# Patient Record
Sex: Male | Born: 1990 | Race: Black or African American | Hispanic: No | Marital: Single | Smoking: Current every day smoker
Health system: Southern US, Community
[De-identification: ages and names within clinical notes are randomized; demographics above are authoritative.]

## PROBLEM LIST (undated history)

## (undated) DIAGNOSIS — B20 Human immunodeficiency virus [HIV] disease: Secondary | ICD-10-CM

## (undated) DIAGNOSIS — Z21 Asymptomatic human immunodeficiency virus [HIV] infection status: Secondary | ICD-10-CM

## (undated) HISTORY — PX: NO PAST SURGERIES: SHX2092

---
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
## 2010-01-29 IMAGING — CR DG CHEST 2V
2 series · 2 of 2 positions shown · non-contrast
Comparison: None

CLINICAL DATA: Chest pain

CHEST - 2 VIEW

[view not recorded (1 of 2)]
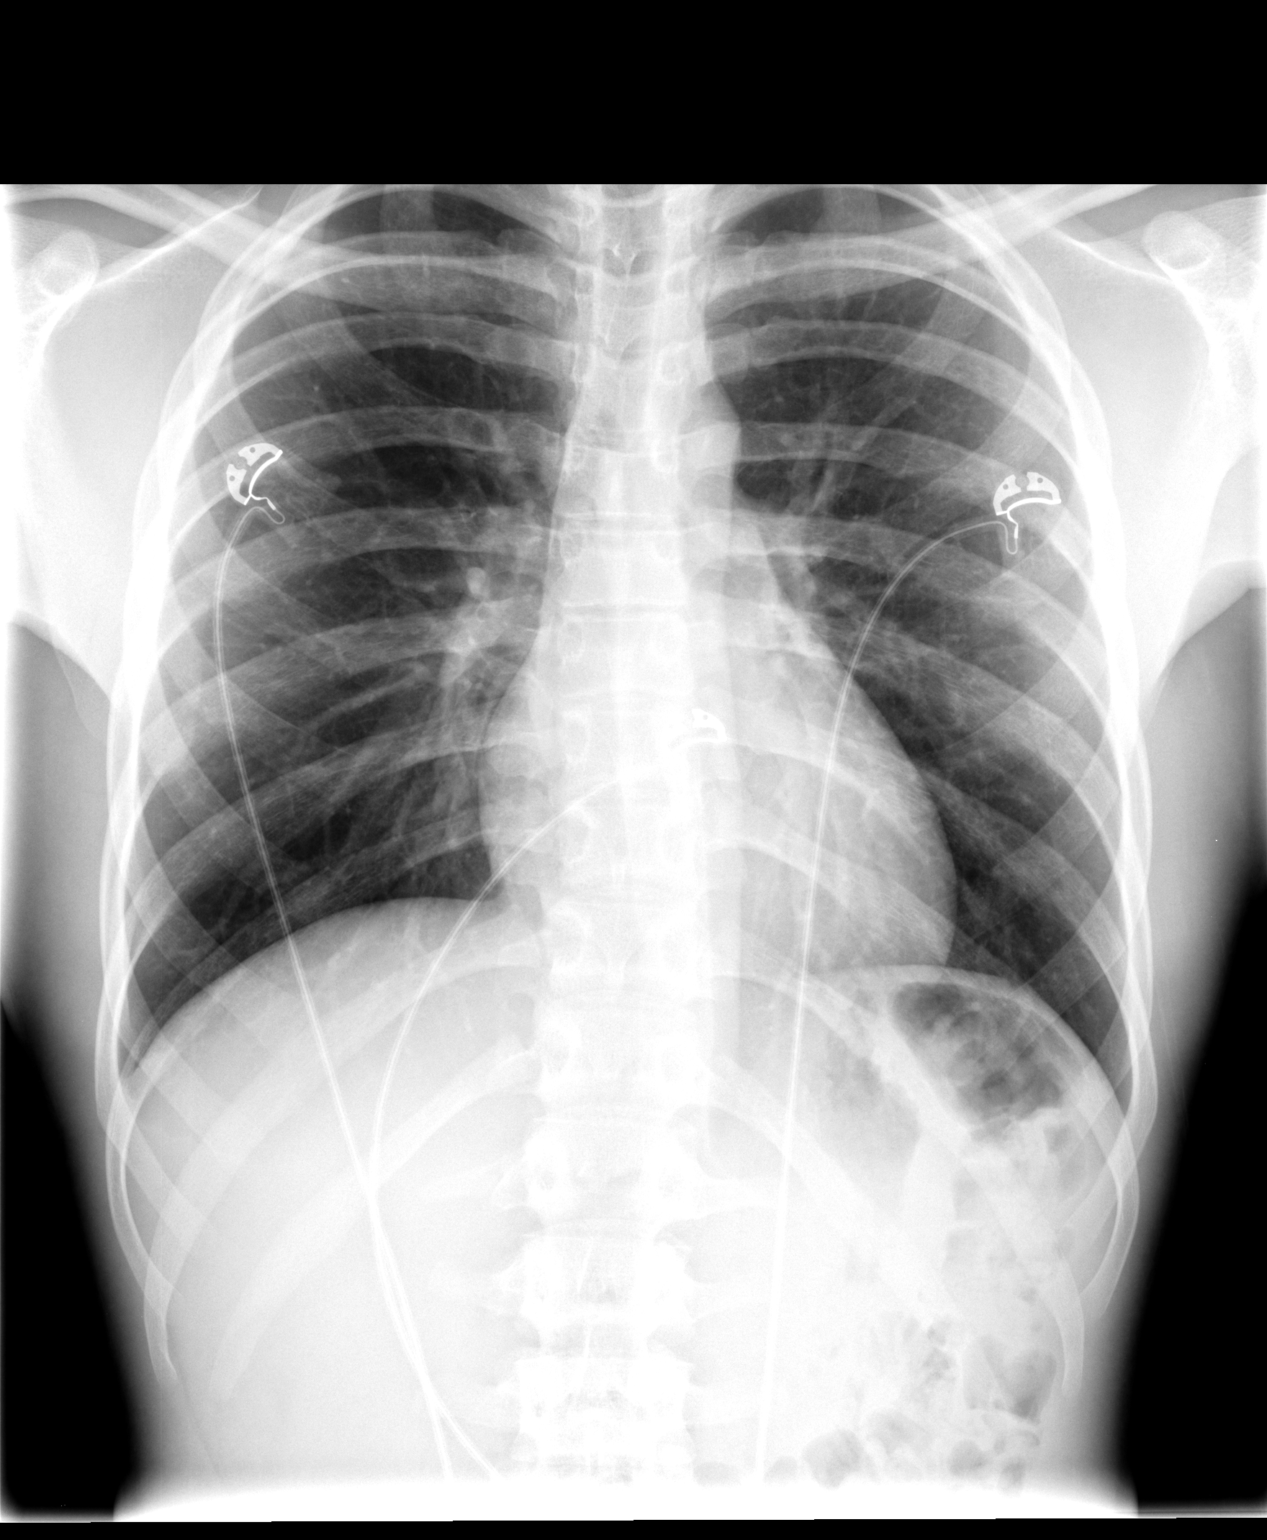

[view not recorded (2 of 2)]
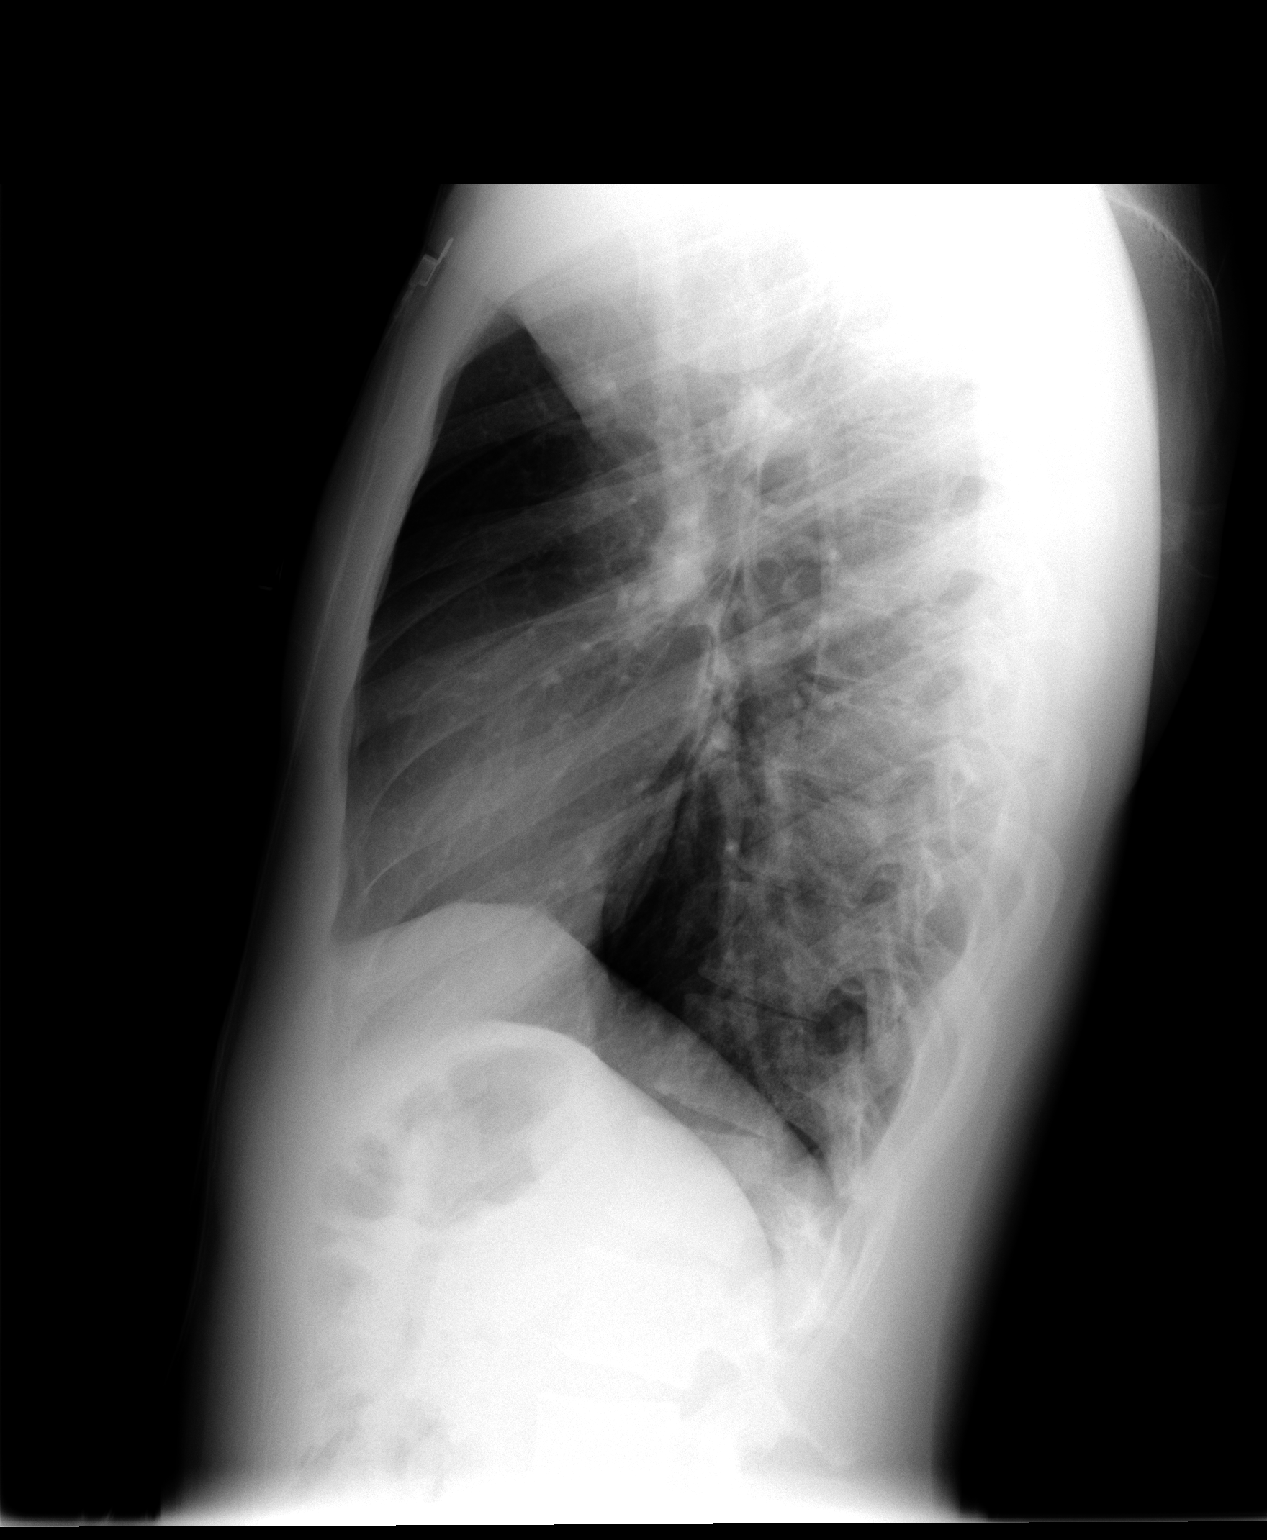

[2 of 2 positions shown; findings below may reference images not displayed]

FINDINGS: The cardiac silhouette, mediastinal and hilar contours
are within normal limits.  The lungs are clear.  The bony thorax is
intact.
IMPRESSION: No acute cardiopulmonary findings.

## 2010-04-15 LAB — GC/CHLAMYDIA PROBE AMP, GENITAL
Chlamydia, DNA Probe: NEGATIVE
GC Probe Amp, Genital: POSITIVE — AB

## 2010-04-15 LAB — RPR: RPR Ser Ql: NONREACTIVE

## 2010-05-10 LAB — RAPID STREP SCREEN (MED CTR MEBANE ONLY): Streptococcus, Group A Screen (Direct): NEGATIVE

## 2010-07-31 ENCOUNTER — Emergency Department (HOSPITAL_COMMUNITY)
Admission: EM | Admit: 2010-07-31 | Discharge: 2010-07-31 | Discharge status: Against Medical Advice | Payer: Self-pay | Attending: Emergency Medicine | Admitting: Emergency Medicine

## 2010-07-31 DIAGNOSIS — Z0389 Encounter for observation for other suspected diseases and conditions ruled out: Secondary | ICD-10-CM | POA: Insufficient documentation

## 2010-11-06 LAB — DIFFERENTIAL
Basophils Absolute: 0 10*3/uL (ref 0.0–0.1)
Basophils Relative: 0 % (ref 0–1)
Eosinophils Absolute: 0.1 10*3/uL (ref 0.0–1.2)
Monocytes Absolute: 0.6 10*3/uL (ref 0.2–1.2)
Monocytes Relative: 6 % (ref 3–11)
Neutro Abs: 7.1 10*3/uL (ref 1.7–8.0)
Neutrophils Relative %: 76 % — ABNORMAL HIGH (ref 43–71)

## 2010-11-06 LAB — CULTURE, ROUTINE-ABSCESS: Gram Stain: NONE SEEN

## 2010-11-06 LAB — BASIC METABOLIC PANEL
CO2: 29 mEq/L (ref 19–32)
Calcium: 9.3 mg/dL (ref 8.4–10.5)
Chloride: 109 mEq/L (ref 96–112)
Creatinine, Ser: 0.81 mg/dL (ref 0.4–1.5)
Glucose, Bld: 103 mg/dL — ABNORMAL HIGH (ref 70–99)

## 2010-11-06 LAB — POCT CARDIAC MARKERS: Myoglobin, poc: 50.9 ng/mL (ref 12–200)

## 2010-11-06 LAB — CBC
MCHC: 33.8 g/dL (ref 31.0–37.0)
MCV: 93.2 fL (ref 78.0–98.0)
RBC: 4.72 MIL/uL (ref 3.80–5.70)
RDW: 12.5 % (ref 11.4–15.5)

## 2013-03-26 ENCOUNTER — Emergency Department (HOSPITAL_COMMUNITY)
Admission: EM | Admit: 2013-03-26 | Discharge: 2013-03-26 | Disposition: A | Discharge status: Home / Self Care | Payer: No Typology Code available for payment source | Attending: Emergency Medicine | Admitting: Emergency Medicine

## 2013-03-26 ENCOUNTER — Encounter (HOSPITAL_COMMUNITY): Payer: Self-pay | Admitting: Emergency Medicine

## 2013-03-26 DIAGNOSIS — M549 Dorsalgia, unspecified: Secondary | ICD-10-CM

## 2013-03-26 DIAGNOSIS — F17200 Nicotine dependence, unspecified, uncomplicated: Secondary | ICD-10-CM | POA: Insufficient documentation

## 2013-03-26 DIAGNOSIS — IMO0002 Reserved for concepts with insufficient information to code with codable children: Secondary | ICD-10-CM | POA: Insufficient documentation

## 2013-03-26 DIAGNOSIS — Y9389 Activity, other specified: Secondary | ICD-10-CM | POA: Insufficient documentation

## 2013-03-26 DIAGNOSIS — Y92410 Unspecified street and highway as the place of occurrence of the external cause: Secondary | ICD-10-CM | POA: Insufficient documentation

## 2013-03-26 DIAGNOSIS — S4980XA Other specified injuries of shoulder and upper arm, unspecified arm, initial encounter: Secondary | ICD-10-CM | POA: Insufficient documentation

## 2013-03-26 DIAGNOSIS — S46909A Unspecified injury of unspecified muscle, fascia and tendon at shoulder and upper arm level, unspecified arm, initial encounter: Secondary | ICD-10-CM | POA: Insufficient documentation

## 2013-03-26 MED ORDER — CYCLOBENZAPRINE HCL 10 MG PO TABS: 10.0000 mg | ORAL_TABLET | Freq: Two times a day (BID) | ORAL | Status: DC | PRN

## 2013-03-26 MED ORDER — NAPROXEN 500 MG PO TABS: 500.0000 mg | ORAL_TABLET | Freq: Two times a day (BID) | ORAL | Status: DC
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
## 2013-03-26 NOTE — Discharge Instructions (Signed)
You'll be sore for several days. Medication for pain and muscle spasm.

## 2014-12-05 ENCOUNTER — Telehealth: Payer: Self-pay

## 2014-12-05 NOTE — Telephone Encounter (Signed)
Referral received from [facility] regarding patient[facility] Message left on machine to call.   Laurell Josephsammy K Margretta Zamorano, RN

## 2014-12-06 ENCOUNTER — Telehealth: Payer: Self-pay

## 2014-12-06 NOTE — Telephone Encounter (Signed)
Patient contacted regarding new intake appointment. Date and time given. Information given regarding documents needed to qualify for financial eligibility.  Tammy K King, RN  

## 2014-12-16 ENCOUNTER — Other Ambulatory Visit: Payer: Self-pay

## 2014-12-16 ENCOUNTER — Ambulatory Visit: Payer: Self-pay

## 2014-12-30 ENCOUNTER — Ambulatory Visit: Payer: Self-pay | Admitting: Infectious Diseases

## 2016-06-13 ENCOUNTER — Encounter (HOSPITAL_COMMUNITY): Payer: Self-pay | Admitting: *Deleted

## 2016-06-13 ENCOUNTER — Emergency Department (HOSPITAL_COMMUNITY)
Admission: EM | Admit: 2016-06-13 | Discharge: 2016-06-13 | Disposition: A | Discharge status: Home / Self Care | Payer: Self-pay | Attending: Emergency Medicine | Admitting: Emergency Medicine

## 2016-06-13 DIAGNOSIS — F17210 Nicotine dependence, cigarettes, uncomplicated: Secondary | ICD-10-CM | POA: Insufficient documentation

## 2016-06-13 DIAGNOSIS — L03317 Cellulitis of buttock: Secondary | ICD-10-CM | POA: Insufficient documentation

## 2016-06-13 MED ORDER — KETOROLAC TROMETHAMINE 60 MG/2ML IM SOLN
60.0000 mg | Freq: Once | INTRAMUSCULAR | Status: AC
  Administered 2016-06-13: 60 mg via INTRAMUSCULAR
  Filled 2016-06-13: qty 2

## 2016-06-13 MED ORDER — CLINDAMYCIN HCL 150 MG PO CAPS
300.0000 mg | ORAL_CAPSULE | Freq: Once | ORAL | Status: AC
  Administered 2016-06-13: 300 mg via ORAL
  Filled 2016-06-13: qty 2

## 2016-06-13 MED ORDER — HYDROCODONE-ACETAMINOPHEN 5-325 MG PO TABS
1.0000 | ORAL_TABLET | Freq: Once | ORAL | Status: AC
  Administered 2016-06-13: 1 via ORAL
  Filled 2016-06-13: qty 1

## 2016-06-13 MED ORDER — CLINDAMYCIN HCL 300 MG PO CAPS: 300.0000 mg | ORAL_CAPSULE | Freq: Four times a day (QID) | ORAL | 0 refills | Status: DC

## 2016-06-13 MED ORDER — ABACAVIR-DOLUTEGRAVIR-LAMIVUD 600-50-300 MG PO TABS
1.0000 | ORAL_TABLET | Freq: Every day | ORAL | 0 refills | Status: AC
Start: 2016-06-13 — End: ?

## 2016-06-13 MED ORDER — IBUPROFEN 800 MG PO TABS: 800.0000 mg | ORAL_TABLET | Freq: Three times a day (TID) | ORAL | 0 refills | Status: DC

## 2016-06-13 NOTE — ED Triage Notes (Addendum)
Pt c/o boil on left buttock that came up on Friday. Pt reports some yellow colored drainage from site. Pt reports cold sweats at night.

## 2016-06-13 NOTE — ED Provider Notes (Signed)
[facility]-EMERGENCY DEPT Provider Note   CSN: [id number] Arrival date & time: 06/13/16  0645     History   Chief Complaint Chief Complaint  Patient presents with  . Abscess    HPI Gerald Cordova is a 26 y.o. male.   Rash   This is a new problem. The current episode started 2 days ago. The problem has been gradually worsening. The problem is associated with nothing. There has been no fever. The rash is present on the left buttock. The pain is moderate. The pain has been constant since onset. Associated symptoms include itching and pain. He has tried antibiotic cream for the symptoms.    History reviewed. No pertinent past medical history.  There are no active problems to display for this patient.   History reviewed. No pertinent surgical history.     Home Medications    Prior to Admission medications   Medication Sig Start Date End Date Taking? Authorizing Provider  abacavir-dolutegravir-lamiVUDine (TRIUMEQ) 600-50-300 MG tablet Take 1 tablet by mouth daily. 06/13/16   Kaiyden Simkin, Barbara Cower, MD  clindamycin (CLEOCIN) 300 MG capsule Take 1 capsule (300 mg total) by mouth 4 (four) times daily. X 7 days 06/13/16   Adamarys Shall, Barbara Cower, MD  cyclobenzaprine (FLEXERIL) 10 MG tablet Take 1 tablet (10 mg total) by mouth 2 (two) times daily as needed for muscle spasms. 03/26/13   Donnetta Hutching, MD  ibuprofen (ADVIL,MOTRIN) 800 MG tablet Take 1 tablet (800 mg total) by mouth 3 (three) times daily. 06/13/16   Gabriella Guile, Barbara Cower, MD  naproxen (NAPROSYN) 500 MG tablet Take 1 tablet (500 mg total) by mouth 2 (two) times daily. 03/26/13   Donnetta Hutching, MD    Family History No family history on file.  Social History Social History  Substance Use Topics  . Smoking status: Current Every Day Smoker  . Smokeless tobacco: Never Used     Comment: 2 cigarettes daily   . Alcohol use Yes     Comment: occasionally      Allergies   Patient has no known allergies.   Review of Systems Review of Systems  Skin:  Positive for itching and rash.  All other systems reviewed and are negative.    Physical Exam Updated Vital Signs BP 118/78   Pulse 84   Temp 99.2 F (37.3 C) (Oral)   Resp 18   Ht 5\' 8"  (1.727 m)   Wt 145 lb (65.8 kg)   SpO2 97%   BMI 22.05 kg/m   Physical Exam  Constitutional: He is oriented to person, place, and time. He appears well-developed and well-nourished.  HENT:  Head: Normocephalic and atraumatic.  Eyes: Conjunctivae and EOM are normal.  Neck: Normal range of motion.  Cardiovascular: Normal rate.   Pulmonary/Chest: Effort normal. No respiratory distress.  Abdominal: He exhibits no distension.  Genitourinary:     Genitourinary Comments: 2x3 cm indurated, tender warm area. No fluctuance or drainage.  Musculoskeletal: Normal range of motion. He exhibits no edema or deformity.  Neurological: He is alert and oriented to person, place, and time.  Skin: Skin is warm and dry.  Nursing note and vitals reviewed.    ED Treatments / Results  Labs (all labs ordered are listed, but only abnormal results are displayed) Labs Reviewed - No data to display  EKG  EKG Interpretation None       Radiology No results found.  Procedures Procedures (including critical care time)  EMERGENCY DEPARTMENT US SOFT TISSUE INTERPRETATION "Study: Limited Soft Tissue Ultrasound"  INDICATIONS: Pain and Soft tissue infection Multiple views of the body part were obtained in real-time with a multi-frequency linear probe  PERFORMED BY: Myself IMAGES ARCHIVED?: Yes SIDE:Left BODY PART:Left Buttock INTERPRETATION:  No abcess noted and Cellulitis present     Medications Ordered in ED Medications  HYDROcodone-acetaminophen (NORCO/VICODIN) 5-325 MG per tablet 1 tablet (not administered)  clindamycin (CLEOCIN) capsule 300 mg (not administered)  ketorolac (TORADOL) injection 60 mg (not administered)     Initial Impression / Assessment and Plan / ED Course  I have  reviewed the triage vital signs and the nursing notes.  Pertinent labs & imaging results that were available during my care of the patient were reviewed by me and considered in my medical decision making (see chart for details).     Cellulitis. No e/o abscess on exam or US. Will treat for same. H/O HIV, needs close follow up with ID to get meds refilled. One month worth given here.   Final Clinical Impressions(s) / ED Diagnoses   Final diagnoses:  Cellulitis of buttock    New Prescriptions New Prescriptions   ABACAVIR-DOLUTEGRAVIR-LAMIVUDINE (TRIUMEQ) 600-50-300 MG TABLET    Take 1 tablet by mouth daily.   CLINDAMYCIN (CLEOCIN) 300 MG CAPSULE    Take 1 capsule (300 mg total) by mouth 4 (four) times daily. X 7 days   IBUPROFEN (ADVIL,MOTRIN) 800 MG TABLET    Take 1 tablet (800 mg total) by mouth 3 (three) times daily.     Jakwon Gayton, Barbara CowerJason, MD 06/13/16 [phone]

## 2018-02-21 ENCOUNTER — Emergency Department (HOSPITAL_COMMUNITY)
Admission: EM | Admit: 2018-02-21 | Discharge: 2018-02-21 | Disposition: A | Discharge status: Home / Self Care | Payer: Self-pay | Attending: Emergency Medicine | Admitting: Emergency Medicine

## 2018-02-21 ENCOUNTER — Other Ambulatory Visit: Payer: Self-pay

## 2018-02-21 ENCOUNTER — Encounter (HOSPITAL_COMMUNITY): Payer: Self-pay | Admitting: Emergency Medicine

## 2018-02-21 DIAGNOSIS — R112 Nausea with vomiting, unspecified: Secondary | ICD-10-CM | POA: Insufficient documentation

## 2018-02-21 DIAGNOSIS — B20 Human immunodeficiency virus [HIV] disease: Secondary | ICD-10-CM | POA: Insufficient documentation

## 2018-02-21 DIAGNOSIS — Z79899 Other long term (current) drug therapy: Secondary | ICD-10-CM | POA: Insufficient documentation

## 2018-02-21 DIAGNOSIS — F17210 Nicotine dependence, cigarettes, uncomplicated: Secondary | ICD-10-CM | POA: Insufficient documentation

## 2018-02-21 HISTORY — DX: Human immunodeficiency virus (HIV) disease: B20

## 2018-02-21 HISTORY — DX: Asymptomatic human immunodeficiency virus (hiv) infection status: Z21

## 2018-02-21 LAB — COMPREHENSIVE METABOLIC PANEL
ALBUMIN: 4.5 g/dL (ref 3.5–5.0)
ALT: 15 U/L (ref 0–44)
ANION GAP: 10 (ref 5–15)
AST: 24 U/L (ref 15–41)
Alkaline Phosphatase: 51 U/L (ref 38–126)
BUN: 6 mg/dL (ref 6–20)
CO2: 26 mmol/L (ref 22–32)
Calcium: 9.2 mg/dL (ref 8.9–10.3)
Chloride: 105 mmol/L (ref 98–111)
Creatinine, Ser: 0.81 mg/dL (ref 0.61–1.24)
Glucose, Bld: 119 mg/dL — ABNORMAL HIGH (ref 70–99)
POTASSIUM: 3.6 mmol/L (ref 3.5–5.1)
SODIUM: 141 mmol/L (ref 135–145)
Total Bilirubin: 0.4 mg/dL (ref 0.3–1.2)
Total Protein: 8 g/dL (ref 6.5–8.1)

## 2018-02-21 LAB — URINALYSIS, ROUTINE W REFLEX MICROSCOPIC
BILIRUBIN URINE: NEGATIVE
Bacteria, UA: NONE SEEN
GLUCOSE, UA: NEGATIVE mg/dL
HGB URINE DIPSTICK: NEGATIVE
KETONES UR: 20 mg/dL — AB
LEUKOCYTES UA: NEGATIVE
NITRITE: NEGATIVE
PH: 7 (ref 5.0–8.0)
Protein, ur: 30 mg/dL — AB
Specific Gravity, Urine: 1.026 (ref 1.005–1.030)

## 2018-02-21 LAB — CBC WITH DIFFERENTIAL/PLATELET
Abs Immature Granulocytes: 0.02 10*3/uL (ref 0.00–0.07)
BASOS ABS: 0 10*3/uL (ref 0.0–0.1)
Basophils Relative: 0 %
EOS ABS: 0 10*3/uL (ref 0.0–0.5)
EOS PCT: 0 %
HEMATOCRIT: 46.7 % (ref 39.0–52.0)
Hemoglobin: 15.2 g/dL (ref 13.0–17.0)
Immature Granulocytes: 0 %
Lymphocytes Relative: 9 %
Lymphs Abs: 0.8 10*3/uL (ref 0.7–4.0)
MCH: 31.6 pg (ref 26.0–34.0)
MCHC: 32.5 g/dL (ref 30.0–36.0)
MCV: 97.1 fL (ref 80.0–100.0)
Monocytes Absolute: 0.3 10*3/uL (ref 0.1–1.0)
Monocytes Relative: 3 %
NRBC: 0 % (ref 0.0–0.2)
Neutro Abs: 8 10*3/uL — ABNORMAL HIGH (ref 1.7–7.7)
Neutrophils Relative %: 88 %
Platelets: 234 10*3/uL (ref 150–400)
RBC: 4.81 MIL/uL (ref 4.22–5.81)
RDW: 12.9 % (ref 11.5–15.5)
WBC: 9.1 10*3/uL (ref 4.0–10.5)

## 2018-02-21 LAB — LIPASE, BLOOD: LIPASE: 18 U/L (ref 11–51)

## 2018-02-21 MED ORDER — SODIUM CHLORIDE 0.9 % IV BOLUS
1000.0000 mL | Freq: Once | INTRAVENOUS | Status: AC
  Administered 2018-02-21: 1000 mL via INTRAVENOUS

## 2018-02-21 MED ORDER — ONDANSETRON HCL 4 MG/2ML IJ SOLN
4.0000 mg | Freq: Once | INTRAMUSCULAR | Status: AC
  Administered 2018-02-21: 4 mg via INTRAVENOUS
  Filled 2018-02-21: qty 2

## 2018-02-21 MED ORDER — ONDANSETRON 4 MG PO TBDP: 4.0000 mg | ORAL_TABLET | Freq: Three times a day (TID) | ORAL | 0 refills | Status: DC | PRN

## 2018-02-21 NOTE — ED Provider Notes (Signed)
[facility] EMERGENCY DEPARTMENT Provider Note   CSN: [id number] Arrival date & time: 02/21/18  0755     History   Chief Complaint Chief Complaint  Patient presents with  . Emesis    HPI Gerald Cordova is a 28 y.o. male with a history significant for HIV, patient reporting he is currently treated for this by [facility] presenting with nausea and vomiting which woke him around 1 AM today.  He felt well when he went to bed last night.  He has had no fevers or chills, denies diarrhea or specific abdominal pain, but just states that "something does not feel right" in his stomach.  He has had no recent illnesses.  He denies chest pain, shortness of breath.  He also denies dysuria or hematuria.  He endorses generalized weakness.  He reports approximately 12 episodes of vomiting, denies hematemesis.  He has had no medications for his symptoms prior to arrival.  The history is provided by the patient.    Past Medical History:  Diagnosis Date  . HIV (human immunodeficiency virus infection) (H[facility])     There are no active problems to display for this patient.   Past Surgical History:  Procedure Laterality Date  . NO PAST SURGERIES          Home Medications    Prior to Admission medications   Medication Sig Start Date End Date Taking? Authorizing Provider  abacavir-dolutegravir-lamiVUDine (TRIUMEQ) 600-50-300 MG tablet Take 1 tablet by mouth daily. 06/13/16   Mesner, Barbara Cower, MD  clindamycin (CLEOCIN) 300 MG capsule Take 1 capsule (300 mg total) by mouth 4 (four) times daily. X 7 days 06/13/16   Mesner, Barbara Cower, MD  cyclobenzaprine (FLEXERIL) 10 MG tablet Take 1 tablet (10 mg total) by mouth 2 (two) times daily as needed for muscle spasms. 03/26/13   Donnetta Hutching, MD  ibuprofen (ADVIL,MOTRIN) 800 MG tablet Take 1 tablet (800 mg total) by mouth 3 (three) times daily. 06/13/16   Mesner, Barbara Cower, MD  naproxen (NAPROSYN) 500 MG tablet Take 1 tablet (500 mg total) by mouth 2 (two) times daily.  03/26/13   Donnetta Hutching, MD  ondansetron (ZOFRAN ODT) 4 MG disintegrating tablet Take 1 tablet (4 mg total) by mouth every 8 (eight) hours as needed for nausea or vomiting. 02/21/18   Burgess Amor, PA-C    Family History History reviewed. No pertinent family history.  Social History Social History   Tobacco Use  . Smoking status: Current Every Day Smoker  . Smokeless tobacco: Never Used  . Tobacco comment: 2 cigarettes daily   Substance Use Topics  . Alcohol use: Yes    Comment: occasionally   . Drug use: Yes    Types: Marijuana     Allergies   Patient has no known allergies.   Review of Systems Review of Systems  Constitutional: Negative for chills and fever.  HENT: Negative for congestion and sore throat.   Eyes: Negative.   Respiratory: Negative for chest tightness and shortness of breath.   Cardiovascular: Negative for chest pain.  Gastrointestinal: Positive for nausea and vomiting. Negative for abdominal distention and diarrhea.  Genitourinary: Negative.   Musculoskeletal: Negative for arthralgias and joint swelling.  Skin: Negative.  Negative for rash and wound.  Neurological: Positive for weakness. Negative for dizziness, light-headedness, numbness and headaches.  Psychiatric/Behavioral: Negative.      Physical Exam Updated Vital Signs BP 116/77   Pulse (!) 56   Temp 97.8 F (36.6 C) (Oral)   Resp  17   Ht 5\' 8"  (1.727 m)   Wt 65.8 kg   SpO2 99%   BMI 22.05 kg/m   Physical Exam Vitals signs and nursing note reviewed.  Constitutional:      Appearance: He is well-developed.  HENT:     Head: Normocephalic and atraumatic.     Mouth/Throat:     Mouth: Mucous membranes are moist.     Pharynx: No oropharyngeal exudate or posterior oropharyngeal erythema.  Eyes:     Conjunctiva/sclera: Conjunctivae normal.  Neck:     Musculoskeletal: Normal range of motion.  Cardiovascular:     Rate and Rhythm: Normal rate and regular rhythm.     Heart sounds: Normal  heart sounds.  Pulmonary:     Effort: Pulmonary effort is normal.     Breath sounds: Normal breath sounds. No wheezing.  Abdominal:     General: Bowel sounds are normal.     Palpations: Abdomen is soft.     Tenderness: There is no abdominal tenderness.  Musculoskeletal: Normal range of motion.  Skin:    General: Skin is warm and dry.  Neurological:     Mental Status: He is alert.      ED Treatments / Results  Labs (all labs ordered are listed, but only abnormal results are displayed) Labs Reviewed  CBC WITH DIFFERENTIAL/PLATELET - Abnormal; Notable for the following components:      Result Value   Neutro Abs 8.0 (*)    All other components within normal limits  COMPREHENSIVE METABOLIC PANEL - Abnormal; Notable for the following components:   Glucose, Bld 119 (*)    All other components within normal limits  URINALYSIS, ROUTINE W REFLEX MICROSCOPIC - Abnormal; Notable for the following components:   Ketones, ur 20 (*)    Protein, ur 30 (*)    All other components within normal limits  LIPASE, BLOOD    EKG None  Radiology No results found.  Procedures Procedures (including critical care time)  Medications Ordered in ED Medications  sodium chloride 0.9 % bolus 1,000 mL ( Intravenous Stopped 02/21/18 1026)  ondansetron (ZOFRAN) injection 4 mg (4 mg Intravenous Given 02/21/18 0909)     Initial Impression / Assessment and Plan / ED Course  I have reviewed the triage vital signs and the nursing notes.  Pertinent labs & imaging results that were available during my care of the patient were reviewed by me and considered in my medical decision making (see chart for details).     Labs reviewed and stable, he has no history of fevers or chills, normal WBC count today.  He was given Zofran and IV fluids and he felt improved with no further emesis in the department.  PRN follow-up anticipated.  He was encouraged to contact his infectious disease MD as he missed his  appointment last month in order for him to maintain his treatment.  Final Clinical Impressions(s) / ED Diagnoses   Final diagnoses:  Nausea and vomiting, intractability of vomiting not specified, unspecified vomiting type    ED Discharge Orders         Ordered    ondansetron (ZOFRAN ODT) 4 MG disintegrating tablet  Every 8 hours PRN     02/21/18 1105           Burgess Amor, PA-C 02/21/18 1112    Vanetta Mulders, MD 02/21/18 1447

## 2018-02-21 NOTE — Discharge Instructions (Addendum)
Make sure you are drinking plenty of fluids.  You may continue to take the Zofran if needed if your nausea or vomiting returns.  You may want to maintain a bland diet for the next day until your symptoms improve.  Get rechecked for any worsening or persistent symptoms.  I suspect you may have picked up a virus which should run its course, however get rechecked for any persistent or new symptoms, especially abdominal pain, fevers or chills, weakness or dizziness.

## 2018-02-21 NOTE — ED Triage Notes (Signed)
Pt c/o of weakness and vomiting since 0100 this am.

## 2019-05-08 ENCOUNTER — Emergency Department (HOSPITAL_COMMUNITY)
Admission: EM | Admit: 2019-05-08 | Discharge: 2019-05-08 | Disposition: A | Discharge status: Home / Self Care | Payer: Self-pay | Attending: Emergency Medicine | Admitting: Emergency Medicine

## 2019-05-08 ENCOUNTER — Encounter (HOSPITAL_COMMUNITY): Payer: Self-pay | Admitting: Emergency Medicine

## 2019-05-08 ENCOUNTER — Other Ambulatory Visit: Payer: Self-pay

## 2019-05-08 DIAGNOSIS — F17210 Nicotine dependence, cigarettes, uncomplicated: Secondary | ICD-10-CM | POA: Insufficient documentation

## 2019-05-08 DIAGNOSIS — Z79899 Other long term (current) drug therapy: Secondary | ICD-10-CM | POA: Insufficient documentation

## 2019-05-08 DIAGNOSIS — L739 Follicular disorder, unspecified: Secondary | ICD-10-CM | POA: Insufficient documentation

## 2019-05-08 DIAGNOSIS — Z21 Asymptomatic human immunodeficiency virus [HIV] infection status: Secondary | ICD-10-CM | POA: Insufficient documentation

## 2019-05-08 MED ORDER — SULFAMETHOXAZOLE-TRIMETHOPRIM 800-160 MG PO TABS: 1.0000 | ORAL_TABLET | Freq: Two times a day (BID) | ORAL | 0 refills | Status: AC

## 2019-05-08 MED ORDER — SULFAMETHOXAZOLE-TRIMETHOPRIM 800-160 MG PO TABS
1.0000 | ORAL_TABLET | Freq: Once | ORAL | Status: AC
  Administered 2019-05-08: 10:00:00 1 via ORAL
  Filled 2019-05-08: qty 1

## 2019-05-08 NOTE — ED Provider Notes (Signed)
[facility] EMERGENCY DEPARTMENT Provider Note   CSN: [id number] Arrival date & time: 05/08/19  4098     History Chief Complaint  Patient presents with  . Mouth Injury    Gerald Cordova is a 29 y.o. male.  HPI      Gerald Cordova is a 29 y.o. male with past medical history significant for HIV, presents to the Emergency Department complaining of gradually worsening swelling of the right face.  States that he noticed a small "bump" along his right jaw on Friday.  Yesterday, he states that he and a family member were squeezing at the area and expressed a small amount of yellow purulent material.  He woke this morning with significantly increased swelling of his face with tenderness.  He denies any known dental caries pain of his teeth, sore throat, neck pain or stiffness, fever or chills, difficulty opening or closing his mouth, and difficulty swallowing.    Past Medical History:  Diagnosis Date  . HIV (human immunodeficiency virus infection) ([place])     There are no problems to display for this patient.   Past Surgical History:  Procedure Laterality Date  . NO PAST SURGERIES         History reviewed. No pertinent family history.  Social History   Tobacco Use  . Smoking status: Current Every Day Smoker  . Smokeless tobacco: Never Used  . Tobacco comment: 2 cigarettes daily   Substance Use Topics  . Alcohol use: Yes    Comment: occasionally   . Drug use: Yes    Types: Marijuana    Home Medications Prior to Admission medications   Medication Sig Start Date End Date Taking? Authorizing Provider  abacavir-dolutegravir-lamiVUDine (TRIUMEQ) 600-50-300 MG tablet Take 1 tablet by mouth daily. 06/13/16   Mesner, Corene Cornea, MD  clindamycin (CLEOCIN) 300 MG capsule Take 1 capsule (300 mg total) by mouth 4 (four) times daily. X 7 days 06/13/16   Mesner, Corene Cornea, MD  cyclobenzaprine (FLEXERIL) 10 MG tablet Take 1 tablet (10 mg total) by mouth 2 (two) times daily as needed for  muscle spasms. 03/26/13   Nat Christen, MD  ibuprofen (ADVIL,MOTRIN) 800 MG tablet Take 1 tablet (800 mg total) by mouth 3 (three) times daily. 06/13/16   Mesner, Corene Cornea, MD  naproxen (NAPROSYN) 500 MG tablet Take 1 tablet (500 mg total) by mouth 2 (two) times daily. 03/26/13   Nat Christen, MD  ondansetron (ZOFRAN ODT) 4 MG disintegrating tablet Take 1 tablet (4 mg total) by mouth every 8 (eight) hours as needed for nausea or vomiting. 02/21/18   Idol, Almyra Free, PA-C  sulfamethoxazole-trimethoprim (BACTRIM DS) 800-160 MG tablet Take 1 tablet by mouth 2 (two) times daily for 10 days. 05/08/19 05/18/19  Mallery Harshman, Lynelle Smoke, PA-C    Allergies    Patient has no known allergies.  Review of Systems   Review of Systems  Constitutional: Negative for chills and fever.  HENT: Positive for facial swelling (swelling, pain to right lower face). Negative for congestion, dental problem, ear pain, rhinorrhea, sore throat and trouble swallowing.   Gastrointestinal: Negative for nausea and vomiting.  Musculoskeletal: Negative for arthralgias and neck pain.  Skin: Negative for color change.  Neurological: Negative for dizziness and headaches.  Hematological: Negative for adenopathy.    Physical Exam Updated Vital Signs BP 133/89 (BP Location: Right Arm)   Pulse 74   Temp 98.7 F (37.1 C) (Oral)   Resp 14   Ht 5\' 8"  (1.727 m)   Wt 65.8  kg   SpO2 98%   BMI 22.05 kg/m   Physical Exam Vitals and nursing note reviewed.  Constitutional:      General: He is not in acute distress.    Appearance: He is well-developed. He is not ill-appearing or toxic-appearing.  HENT:     Head:      Comments: 1 cm open pustule to the right lower face near the angle of the mandible with surrounding soft tissue swelling.  Mild  Induration noted.  No erythema or fluctuance noted.      Right Ear: Tympanic membrane and ear canal normal.     Left Ear: Tympanic membrane and ear canal normal.     Nose: No congestion.     Mouth/Throat:      Mouth: Mucous membranes are moist. No oral lesions.     Dentition: No dental tenderness, gingival swelling, dental caries or gum lesions.     Pharynx: Oropharynx is clear. Uvula midline. No pharyngeal swelling, oropharyngeal exudate, posterior oropharyngeal erythema or uvula swelling.     Tonsils: No tonsillar exudate.     Comments: No erythema or edema of the oropharynx.  Uvula is midline and nonedematous.  No dental or gingival tenderness on exam.  No trismus Cardiovascular:     Rate and Rhythm: Normal rate and regular rhythm.     Pulses: Normal pulses.  Pulmonary:     Effort: Pulmonary effort is normal.     Breath sounds: Normal breath sounds.  Musculoskeletal:     Cervical back: Normal range of motion. No tenderness.  Lymphadenopathy:     Cervical: No cervical adenopathy.  Skin:    General: Skin is warm.     Findings: No erythema.  Neurological:     Mental Status: He is alert and oriented to person, place, and time.     Motor: No abnormal muscle tone.     Coordination: Coordination normal.     ED Results / Procedures / Treatments   Labs (all labs ordered are listed, but only abnormal results are displayed) Labs Reviewed - No data to display  EKG None  Radiology No results found.  Procedures Procedures (including critical care time)  Medications Ordered in ED Medications  sulfamethoxazole-trimethoprim (BACTRIM DS) 800-160 MG per tablet 1 tablet (1 tablet Oral Given 05/08/19 1155)    ED Course  I have reviewed the triage vital signs and the nursing notes.  Pertinent labs & imaging results that were available during my care of the patient were reviewed by me and considered in my medical decision making (see chart for details).    MDM Rules/Calculators/A&P                      Pt appears well and non-toxic.  He has a single, 1 cm open pustule to the right lower face without fluctuance, erythema or drainage.  I feel his facial edema is likely related to trauma to  the skin from squeezing.  His airway is patent and there is no oral symptoms to suggest dental abscess, or Ludwig's angina.  No tenderness or edema of the neck, and given the presence of a definite pustule of the skin, I doubt parotitis or sialoadenitis.  No indication for I&D at this time.    I have counseled him on avoiding further skin irritation, to use warm compresses and I will start antibiotic for possible MRSA.  I have also advised that he return in 1-2 days if the symptoms are not improving  Final Clinical Impression(s) / ED Diagnoses Final diagnoses:  Folliculitis    Rx / DC Orders ED Discharge Orders         Ordered    sulfamethoxazole-trimethoprim (BACTRIM DS) 800-160 MG tablet  2 times daily     05/08/19 0932           Pauline Aus, PA-C 05/08/19 1441    Sabas Sous, MD 05/09/19 [phone]

## 2019-05-08 NOTE — ED Triage Notes (Signed)
Swelling to right since Friday.  Swelling increased overnight.  Denies any pain.

## 2019-05-08 NOTE — Progress Notes (Signed)
CSW has reviewed charted and notes that patient is without a pcp and/or insurance. CSW attempting to call down to patients room to confirm this information and provide assistance if needed. CSW unsuccessful in speaking with patient via room phone. CSW will follow up with patient at bedside regarding this matter   Gerald Cordova Tomma Rakers Transitions of Care  Clinical Social Worker  Ph: [phone]

## 2019-05-08 NOTE — Discharge Instructions (Signed)
Apply warm wet compresses on and off to your face.  Take the antibiotic as directed until its finished.  Avoid squeezing the affected area or sticking it with pins or needles.  Return to the emergency department in 1 to 2 days if you develop fever or chills, difficulty swallowing, neck pain or increasing facial swelling.

## 2019-07-27 ENCOUNTER — Encounter (HOSPITAL_COMMUNITY): Payer: Self-pay | Admitting: *Deleted

## 2019-07-27 ENCOUNTER — Emergency Department (HOSPITAL_COMMUNITY)
Admission: EM | Admit: 2019-07-27 | Discharge: 2019-07-27 | Disposition: A | Discharge status: Home / Self Care | Payer: Self-pay | Attending: Emergency Medicine | Admitting: Emergency Medicine

## 2019-07-27 ENCOUNTER — Other Ambulatory Visit: Payer: Self-pay

## 2019-07-27 DIAGNOSIS — F17210 Nicotine dependence, cigarettes, uncomplicated: Secondary | ICD-10-CM | POA: Insufficient documentation

## 2019-07-27 DIAGNOSIS — Z79899 Other long term (current) drug therapy: Secondary | ICD-10-CM | POA: Insufficient documentation

## 2019-07-27 DIAGNOSIS — R197 Diarrhea, unspecified: Secondary | ICD-10-CM | POA: Insufficient documentation

## 2019-07-27 DIAGNOSIS — Z21 Asymptomatic human immunodeficiency virus [HIV] infection status: Secondary | ICD-10-CM | POA: Insufficient documentation

## 2019-07-27 DIAGNOSIS — R1030 Lower abdominal pain, unspecified: Secondary | ICD-10-CM | POA: Insufficient documentation

## 2019-07-27 DIAGNOSIS — R112 Nausea with vomiting, unspecified: Secondary | ICD-10-CM | POA: Insufficient documentation

## 2019-07-27 LAB — URINALYSIS, ROUTINE W REFLEX MICROSCOPIC
Bacteria, UA: NONE SEEN
Glucose, UA: NEGATIVE mg/dL
Ketones, ur: 5 mg/dL — AB
Nitrite: NEGATIVE
Protein, ur: 30 mg/dL — AB
Specific Gravity, Urine: 1.027 (ref 1.005–1.030)
pH: 6 (ref 5.0–8.0)

## 2019-07-27 LAB — BASIC METABOLIC PANEL
Anion gap: 9 (ref 5–15)
BUN: 6 mg/dL (ref 6–20)
CO2: 27 mmol/L (ref 22–32)
Calcium: 9 mg/dL (ref 8.9–10.3)
Chloride: 104 mmol/L (ref 98–111)
Creatinine, Ser: 0.85 mg/dL (ref 0.61–1.24)
GFR calc Af Amer: >60 mL/min (ref >60)
GFR calc non Af Amer: >60 mL/min (ref >60)
Glucose, Bld: 90 mg/dL (ref 70–99)
Potassium: 3.7 mmol/L (ref 3.5–5.1)
Sodium: 140 mmol/L (ref 135–145)

## 2019-07-27 LAB — CBC WITH DIFFERENTIAL/PLATELET
Abs Immature Granulocytes: 0.02 10*3/uL (ref 0.00–0.07)
Basophils Absolute: 0 10*3/uL (ref 0.0–0.1)
Basophils Relative: 1 %
Eosinophils Absolute: 0.1 10*3/uL (ref 0.0–0.5)
Eosinophils Relative: 1 %
HCT: 47.5 % (ref 39.0–52.0)
Hemoglobin: 15.8 g/dL (ref 13.0–17.0)
Immature Granulocytes: 0 %
Lymphocytes Relative: 27 %
Lymphs Abs: 2 10*3/uL (ref 0.7–4.0)
MCH: 34.2 pg — ABNORMAL HIGH (ref 26.0–34.0)
MCHC: 33.3 g/dL (ref 30.0–36.0)
MCV: 102.8 fL — ABNORMAL HIGH (ref 80.0–100.0)
Monocytes Absolute: 0.6 10*3/uL (ref 0.1–1.0)
Monocytes Relative: 8 %
Neutro Abs: 4.8 10*3/uL (ref 1.7–7.7)
Neutrophils Relative %: 63 %
Platelets: 218 10*3/uL (ref 150–400)
RBC: 4.62 MIL/uL (ref 4.22–5.81)
RDW: 12.8 % (ref 11.5–15.5)
WBC: 7.5 10*3/uL (ref 4.0–10.5)
nRBC: 0 % (ref 0.0–0.2)

## 2019-07-27 MED ORDER — DICYCLOMINE HCL 20 MG PO TABS
20.0000 mg | ORAL_TABLET | Freq: Two times a day (BID) | ORAL | 0 refills | Status: AC
Start: 2019-07-27 — End: ?

## 2019-07-27 MED ORDER — ONDANSETRON 8 MG PO TBDP
8.0000 mg | ORAL_TABLET | Freq: Once | ORAL | Status: DC
  Filled 2019-07-27: qty 1

## 2019-07-27 MED ORDER — ONDANSETRON 4 MG PO TBDP: 4.0000 mg | ORAL_TABLET | Freq: Three times a day (TID) | ORAL | 0 refills | Status: AC | PRN

## 2019-07-27 NOTE — ED Triage Notes (Signed)
Patient comes to the ED with intermittent abdominal pain with diarrhea for 5 days.

## 2019-07-27 NOTE — ED Notes (Signed)
Pt reports that since last Sunday he has had   Intermittent abd pain that he describes as burning   Followed by [facility]   Has not seen PCP  Here for  eval

## 2019-07-27 NOTE — ED Provider Notes (Signed)
[facility] EMERGENCY DEPARTMENT Provider Note   CSN: [id number] Arrival date & time: 07/27/19  1234     History Chief Complaint  Patient presents with  . Abdominal Pain    Gerald Cordova is a 29 y.o. male.  29 year old male presents with complaint of vomiting and abdominal pain.  Patient states his symptoms started on Sunday (5 days ago) with burning lower abdominal pain.  Patient developed watery nonbloody diarrhea.  Patient states that he has not had anything to eat lately secondary to vomiting.  Emesis described as nonbloody/nonbilious.  Patient has had small sips of liquid today without further vomiting.  Denies fevers, chills.  Patient has a history of HIV, states that he is followed by [facility] infectious disease and is compliant with his medications.        Past Medical History:  Diagnosis Date  . HIV (human immunodeficiency virus infection) (HCC)     There are no problems to display for this patient.   Past Surgical History:  Procedure Laterality Date  . NO PAST SURGERIES         History reviewed. No pertinent family history.  Social History   Tobacco Use  . Smoking status: Current Every Day Smoker  . Smokeless tobacco: Never Used  . Tobacco comment: 2 cigarettes daily   Substance Use Topics  . Alcohol use: Yes    Comment: occasionally   . Drug use: Yes    Types: Marijuana    Home Medications Prior to Admission medications   Medication Sig Start Date End Date Taking? Authorizing Provider  abacavir-dolutegravir-lamiVUDine (TRIUMEQ) 600-50-300 MG tablet Take 1 tablet by mouth daily. 06/13/16  Yes Mesner, Barbara Cower, MD  dicyclomine (BENTYL) 20 MG tablet Take 1 tablet (20 mg total) by mouth 2 (two) times daily. 07/27/19   Jeannie Fend, PA-C  ondansetron (ZOFRAN ODT) 4 MG disintegrating tablet Take 1 tablet (4 mg total) by mouth every 8 (eight) hours as needed for nausea or vomiting. 07/27/19   Jeannie Fend, PA-C    Allergies    Patient  has no known allergies.  Review of Systems   Review of Systems  Constitutional: Negative for chills, diaphoresis and fever.  Gastrointestinal: Positive for abdominal pain, diarrhea, nausea and vomiting. Negative for blood in stool and constipation.  Genitourinary: Negative for difficulty urinating and dysuria.  Musculoskeletal: Negative for arthralgias, joint swelling and myalgias.  Skin: Negative for rash and wound.  Allergic/Immunologic: Positive for immunocompromised state.  Neurological: Negative for weakness.  All other systems reviewed and are negative.   Physical Exam Updated Vital Signs BP 131/77 (BP Location: Right Arm)   Pulse 68   Temp 98.8 F (37.1 C) (Oral)   Resp 16   Ht 5\' 9"  (1.753 m)   Wt 68.5 kg   SpO2 99%   BMI 22.30 kg/m   Physical Exam Vitals and nursing note reviewed.  Constitutional:      General: He is not in acute distress.    Appearance: He is well-developed. He is not diaphoretic.  HENT:     Head: Normocephalic and atraumatic.  Cardiovascular:     Rate and Rhythm: Normal rate and regular rhythm.     Heart sounds: Normal heart sounds.  Pulmonary:     Effort: Pulmonary effort is normal.     Breath sounds: Normal breath sounds.  Abdominal:     General: Bowel sounds are normal.     Palpations: Abdomen is soft.     Tenderness: There  is generalized abdominal tenderness. There is no right CVA tenderness, left CVA tenderness, guarding or rebound. Negative signs include Hasna Stefanik's sign.     Comments: Mild generalized abdominal tenderness.  Skin:    General: Skin is warm and dry.     Findings: No erythema or rash.  Neurological:     Mental Status: He is alert and oriented to person, place, and time.  Psychiatric:        Behavior: Behavior normal.     ED Results / Procedures / Treatments   Labs (all labs ordered are listed, but only abnormal results are displayed) Labs Reviewed  CBC WITH DIFFERENTIAL/PLATELET - Abnormal; Notable for the  following components:      Result Value   MCV 102.8 (*)    MCH 34.2 (*)    All other components within normal limits  URINALYSIS, ROUTINE W REFLEX MICROSCOPIC - Abnormal; Notable for the following components:   Color, Urine AMBER (*)    Hgb urine dipstick SMALL (*)    Bilirubin Urine SMALL (*)    Ketones, ur 5 (*)    Protein, ur 30 (*)    Leukocytes,Ua TRACE (*)    All other components within normal limits  BASIC METABOLIC PANEL    EKG None  Radiology No results found.  Procedures Procedures (including critical care time)  Medications Ordered in ED Medications  ondansetron (ZOFRAN-ODT) disintegrating tablet 8 mg (8 mg Oral Not Given 07/27/19 1451)    ED Course  I have reviewed the triage vital signs and the nursing notes.  Pertinent labs & imaging results that were available during my care of the patient were reviewed by me and considered in my medical decision making (see chart for details).  Clinical Course as of Jul 27 1531  Fri Jul 26, 6952  2610 29 year old male with HIV presents with lower abdominal burning with vomiting and loose stools.  On exam, patient is well-appearing, has mild tenderness lower abdomen, no rebound tenderness or guarding.  CBC is reassuring, BMP unremarkable, urinalysis with ketones and protein.  Patient was given Zofran ODT, tolerated p.o. fluids and was ready for discharge.  Patient given prescription for Zofran and Bentyl, recommend follow-up with his PCP or see his ID doctor, return to ER for new or worsening symptoms.   [LM]    Clinical Course User Index [LM] Roque Lias   MDM Rules/Calculators/A&P                          Final Clinical Impression(s) / ED Diagnoses Final diagnoses:  Lower abdominal pain  Nausea vomiting and diarrhea    Rx / DC Orders ED Discharge Orders         Ordered    ondansetron (ZOFRAN ODT) 4 MG disintegrating tablet  Every 8 hours PRN     Discontinue  Reprint     07/27/19 1521    dicyclomine  (BENTYL) 20 MG tablet  2 times daily     Discontinue  Reprint     07/27/19 1521           Tacy Learn, PA-C 07/27/19 1533    Daleen Bo, MD 07/27/19 [phone]

## 2019-07-27 NOTE — ED Notes (Signed)
Pt given water to sip on.  

## 2019-07-27 NOTE — Discharge Instructions (Signed)
Take Zofran and Bentyl as needed for pain and vomiting. Return to ER for fevers or worsening pain otherwise recheck with your primary care provider.

## 2019-11-19 ENCOUNTER — Emergency Department (HOSPITAL_COMMUNITY)
Admission: EM | Admit: 2019-11-19 | Discharge: 2019-11-19 | Disposition: A | Discharge status: Home / Self Care | Payer: Self-pay | Attending: Emergency Medicine | Admitting: Emergency Medicine

## 2019-11-19 ENCOUNTER — Other Ambulatory Visit: Payer: Self-pay

## 2019-11-19 ENCOUNTER — Encounter (HOSPITAL_COMMUNITY): Payer: Self-pay

## 2019-11-19 DIAGNOSIS — F419 Anxiety disorder, unspecified: Secondary | ICD-10-CM | POA: Insufficient documentation

## 2019-11-19 DIAGNOSIS — F17210 Nicotine dependence, cigarettes, uncomplicated: Secondary | ICD-10-CM | POA: Insufficient documentation

## 2019-11-19 DIAGNOSIS — J209 Acute bronchitis, unspecified: Secondary | ICD-10-CM | POA: Insufficient documentation

## 2019-11-19 DIAGNOSIS — J40 Bronchitis, not specified as acute or chronic: Secondary | ICD-10-CM

## 2019-11-19 MED ORDER — LORAZEPAM 1 MG PO TABS
1.0000 mg | ORAL_TABLET | Freq: Once | ORAL | Status: AC
  Administered 2019-11-19: 1 mg via ORAL
  Filled 2019-11-19: qty 1

## 2019-11-19 NOTE — ED Provider Notes (Signed)
[facility] EMERGENCY DEPARTMENT Provider Note   CSN: [id number] Arrival date & time: 11/19/19  1734     History Chief Complaint  Patient presents with  . Anxiety    Gerald Cordova is a 29 y.o. male.  The history is provided by the patient.  Anxiety This is a new problem. The current episode started more than 2 days ago. The problem occurs daily. The problem has not changed since onset.Associated symptoms include shortness of breath. Pertinent negatives include no chest pain, no abdominal pain and no headaches. Nothing aggravates the symptoms. Nothing relieves the symptoms.  Patient presents with anxiety.  He reports over the past several days has had cough and congestion, and he slept with a fan on which triggered him to wake up and feel very anxious.  He called EMS and was told his vital signs were normal.  Patient reports the cough is not worsening, no hemoptysis, no chest pain.  He will get short of breath during anxiety attack.  He describes cold chills and sweats.  No syncope.  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  He reports decreased sleep.  He reports he is fully vaccinated for Covid and gets tested weekly for his job so he does not feel this is Covid    Past Medical History:  Diagnosis Date  . HIV (human immunodeficiency virus infection) (HCC)     There are no problems to display for this patient.   Past Surgical History:  Procedure Laterality Date  . NO PAST SURGERIES         No family history on file.  Social History   Tobacco Use  . Smoking status: Current Every Day Smoker    Types: Cigarettes  . Smokeless tobacco: Never Used  . Tobacco comment: 2 cigarettes daily   Substance Use Topics  . Alcohol use: Yes    Comment: occasionally   . Drug use: Yes    Types: Marijuana    Home Medications Prior to Admission medications   Medication Sig Start Date End Date Taking? Authorizing  Provider  abacavir-dolutegravir-lamiVUDine (TRIUMEQ) 600-50-300 MG tablet Take 1 tablet by mouth daily. 06/13/16   Mesner, Barbara Cower, MD  dicyclomine (BENTYL) 20 MG tablet Take 1 tablet (20 mg total) by mouth 2 (two) times daily. 07/27/19   Jeannie Fend, PA-C  ondansetron (ZOFRAN ODT) 4 MG disintegrating tablet Take 1 tablet (4 mg total) by mouth every 8 (eight) hours as needed for nausea or vomiting. 07/27/19   Jeannie Fend, PA-C    Allergies    Patient has no known allergies.  Review of Systems   Review of Systems  Constitutional: Positive for diaphoresis. Negative for fever.  HENT: Positive for congestion.   Respiratory: Positive for cough and shortness of breath.   Cardiovascular: Negative for chest pain.  Gastrointestinal: Negative for abdominal pain.  Neurological: Negative for headaches.  Psychiatric/Behavioral: The patient is nervous/anxious.   All other systems reviewed and are negative.   Physical Exam Updated Vital Signs BP (!) 151/92 (BP Location: Right Arm)   Pulse 85   Temp 98.2 F (36.8 C) (Oral)   Resp 18   Ht 1.753 m (5\' 9" )   Wt 68 kg   SpO2 96%   BMI 22.15 kg/m   Physical Exam CONSTITUTIONAL: Well developed/well nourished HEAD: Normocephalic/atraumatic EYES: EOMI/PERRL ENMT: Mucous membranes moist, uvula midline without erythema or exudates, no angioedema NECK: supple no meningeal signs  SPINE/BACK:entire spine nontender CV: S1/S2 noted, no murmurs/rubs/gallops noted LUNGS: Scattered wheezing, no apparent distress ABDOMEN: soft, nontender, no rebound or guarding, bowel sounds noted throughout abdomen GU:no cva tenderness NEURO: Pt is awake/alert/appropriate, moves all extremitiesx4.  No facial droop.   EXTREMITIES: pulses normal/equal, full ROM SKIN: warm, color normal PSYCH: Mildly anxious  ED Results / Procedures / Treatments   Labs (all labs ordered are listed, but only abnormal results are displayed) Labs Reviewed - No data to  display  EKG None  Radiology No results found.  Procedures Procedures (including critical care time)  Medications Ordered in ED Medications  LORazepam (ATIVAN) tablet 1 mg (has no administration in time range)    ED Course  I have reviewed the triage vital signs and the nursing notes.     MDM Rules/Calculators/A&P                          Patient presents mostly because he is concerned about his anxiety attacks.  He reports he plans to have an evaluation tomorrow by his doctor. For his cough, he has mild wheezing likely due to bronchitis, advised to quit smoking He has never had anxiety before so he is unsure what is causing his symptoms  Final Clinical Impression(s) / ED Diagnoses Final diagnoses:  Anxiety  Bronchitis    Rx / DC Orders ED Discharge Orders    None       Zadie Rhine, MD 11/19/19 2340

## 2019-11-19 NOTE — ED Triage Notes (Addendum)
Pt presents to ED with complaints of increased anxiety over the last couple of days. Pt states it was triggered because he woke up with a cough and runny nose yesterday where he slept with a fan on, he called EMS and they told him his vitals and oxygen were ok. Pt states the cough is better now but states the anxiety is still there. Pt states he has been pacing the floors today. 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

## 2024-02-01 ENCOUNTER — Emergency Department (HOSPITAL_COMMUNITY)
Admission: EM | Admit: 2024-02-01 | Discharge: 2024-02-01 | Disposition: A | Discharge status: Home / Self Care | Attending: Emergency Medicine | Admitting: Emergency Medicine

## 2024-02-01 ENCOUNTER — Encounter (HOSPITAL_COMMUNITY): Payer: Self-pay

## 2024-02-01 DIAGNOSIS — Z79899 Other long term (current) drug therapy: Secondary | ICD-10-CM | POA: Diagnosis not present

## 2024-02-01 DIAGNOSIS — L0501 Pilonidal cyst with abscess: Secondary | ICD-10-CM

## 2024-02-01 DIAGNOSIS — L0591 Pilonidal cyst without abscess: Secondary | ICD-10-CM | POA: Insufficient documentation

## 2024-02-01 MED ORDER — CLINDAMYCIN HCL 300 MG PO CAPS: 300.0000 mg | ORAL_CAPSULE | Freq: Four times a day (QID) | ORAL | 0 refills | Status: AC

## 2024-02-01 MED ORDER — CLINDAMYCIN HCL 150 MG PO CAPS
300.0000 mg | ORAL_CAPSULE | Freq: Once | ORAL | Status: AC
  Administered 2024-02-01: 300 mg via ORAL
  Filled 2024-02-01: qty 2

## 2024-02-01 NOTE — Discharge Instructions (Signed)
 Begin taking clindamycin  as prescribed.  Apply warm compresses or perform sitz bath's as frequently as possible for the next several days.  Follow-up with primary doctor if symptoms persist, and return to the ER if symptoms significantly worsen or change.

## 2024-02-01 NOTE — ED Provider Notes (Signed)
" °  [place] EMERGENCY DEPARTMENT AT [facility] Provider Note   CSN: [id number] Arrival date & time: 02/01/24  0300     Patient presents with: Abscess   Gerald Cordova is a 33 y.o. male.   Patient is a 33 year old male presenting with a boil to his buttocks.  This has been present for the past several days.  He has been sitting in the bathtub and it is draining purulent material.  No fevers or chills.  He has never had this problem before.       Prior to Admission medications  Medication Sig Start Date End Date Taking? Authorizing Provider  abacavir -dolutegravir -lamiVUDine (TRIUMEQ) 600-50-300 MG tablet Take 1 tablet by mouth daily. 06/13/16   Mesner, Selinda, MD  dicyclomine  (BENTYL ) 20 MG tablet Take 1 tablet (20 mg total) by mouth 2 (two) times daily. 07/27/19   Beverley Leita LABOR, PA-C  ondansetron  (ZOFRAN  ODT) 4 MG disintegrating tablet Take 1 tablet (4 mg total) by mouth every 8 (eight) hours as needed for nausea or vomiting. 07/27/19   Beverley Leita LABOR, PA-C    Allergies: Patient has no known allergies.    Review of Systems  All other systems reviewed and are negative.   Updated Vital Signs BP (!) 127/93 (BP Location: Right Arm)   Pulse (!) 105   Temp 98.6 F (37 C) (Oral)   Resp 16   SpO2 97%   Physical Exam Vitals and nursing note reviewed.  HENT:     Head: Normocephalic.  Pulmonary:     Effort: Pulmonary effort is normal.  Skin:    General: Skin is warm and dry.     Comments: Patient has a pilonidal abscess that is draining purulent material.  There is some surrounding induration.  Neurological:     Mental Status: He is alert and oriented to person, place, and time.     (all labs ordered are listed, but only abnormal results are displayed) Labs Reviewed - No data to display  EKG: None  Radiology: No results found.   Procedures   Medications Ordered in the ED  clindamycin  (CLEOCIN ) capsule 300 mg (has no administration in time range)                                     Medical Decision Making Risk Prescription drug management.   Patient presenting with a spontaneously draining pilonidal cyst/abscess.  He will be treated with clindamycin  and continued warm compresses.  Return as needed for any problems.     Final diagnoses:  None    ED Discharge Orders     None          Geroldine Berg, MD 02/01/24 [phone]  "

## 2024-02-01 NOTE — ED Triage Notes (Signed)
 Pt report having a cyst on buttocks, pt report drainage and discomfort.   Started a week ago. Pt denies pain, but endorse discomfort.   Alert and oriented x4 ambulatory on triage
